# Patient Record
Sex: Male | Born: 1980 | Race: Black or African American | Hispanic: No | Marital: Single
Health system: Southern US, Community
[De-identification: ages and names within clinical notes are randomized; demographics above are authoritative.]

---

## 2016-09-07 ENCOUNTER — Other Ambulatory Visit: Payer: Self-pay | Admitting: Internal Medicine

## 2016-09-07 ENCOUNTER — Ambulatory Visit
Admission: RE | Admit: 2016-09-07 | Discharge: 2016-09-07 | Disposition: A | Discharge status: Home / Self Care | Payer: No Typology Code available for payment source | Source: Ambulatory Visit | Attending: Internal Medicine | Admitting: Internal Medicine

## 2016-09-07 DIAGNOSIS — R7611 Nonspecific reaction to tuberculin skin test without active tuberculosis: Secondary | ICD-10-CM

## 2018-08-08 IMAGING — DX DG CHEST 1V
1 series · 1 of 1 positions shown · non-contrast
Comparison: None

CLINICAL DATA: Positive PPD

EXAM:
CHEST 1 VIEW

[dg chest 1 view]
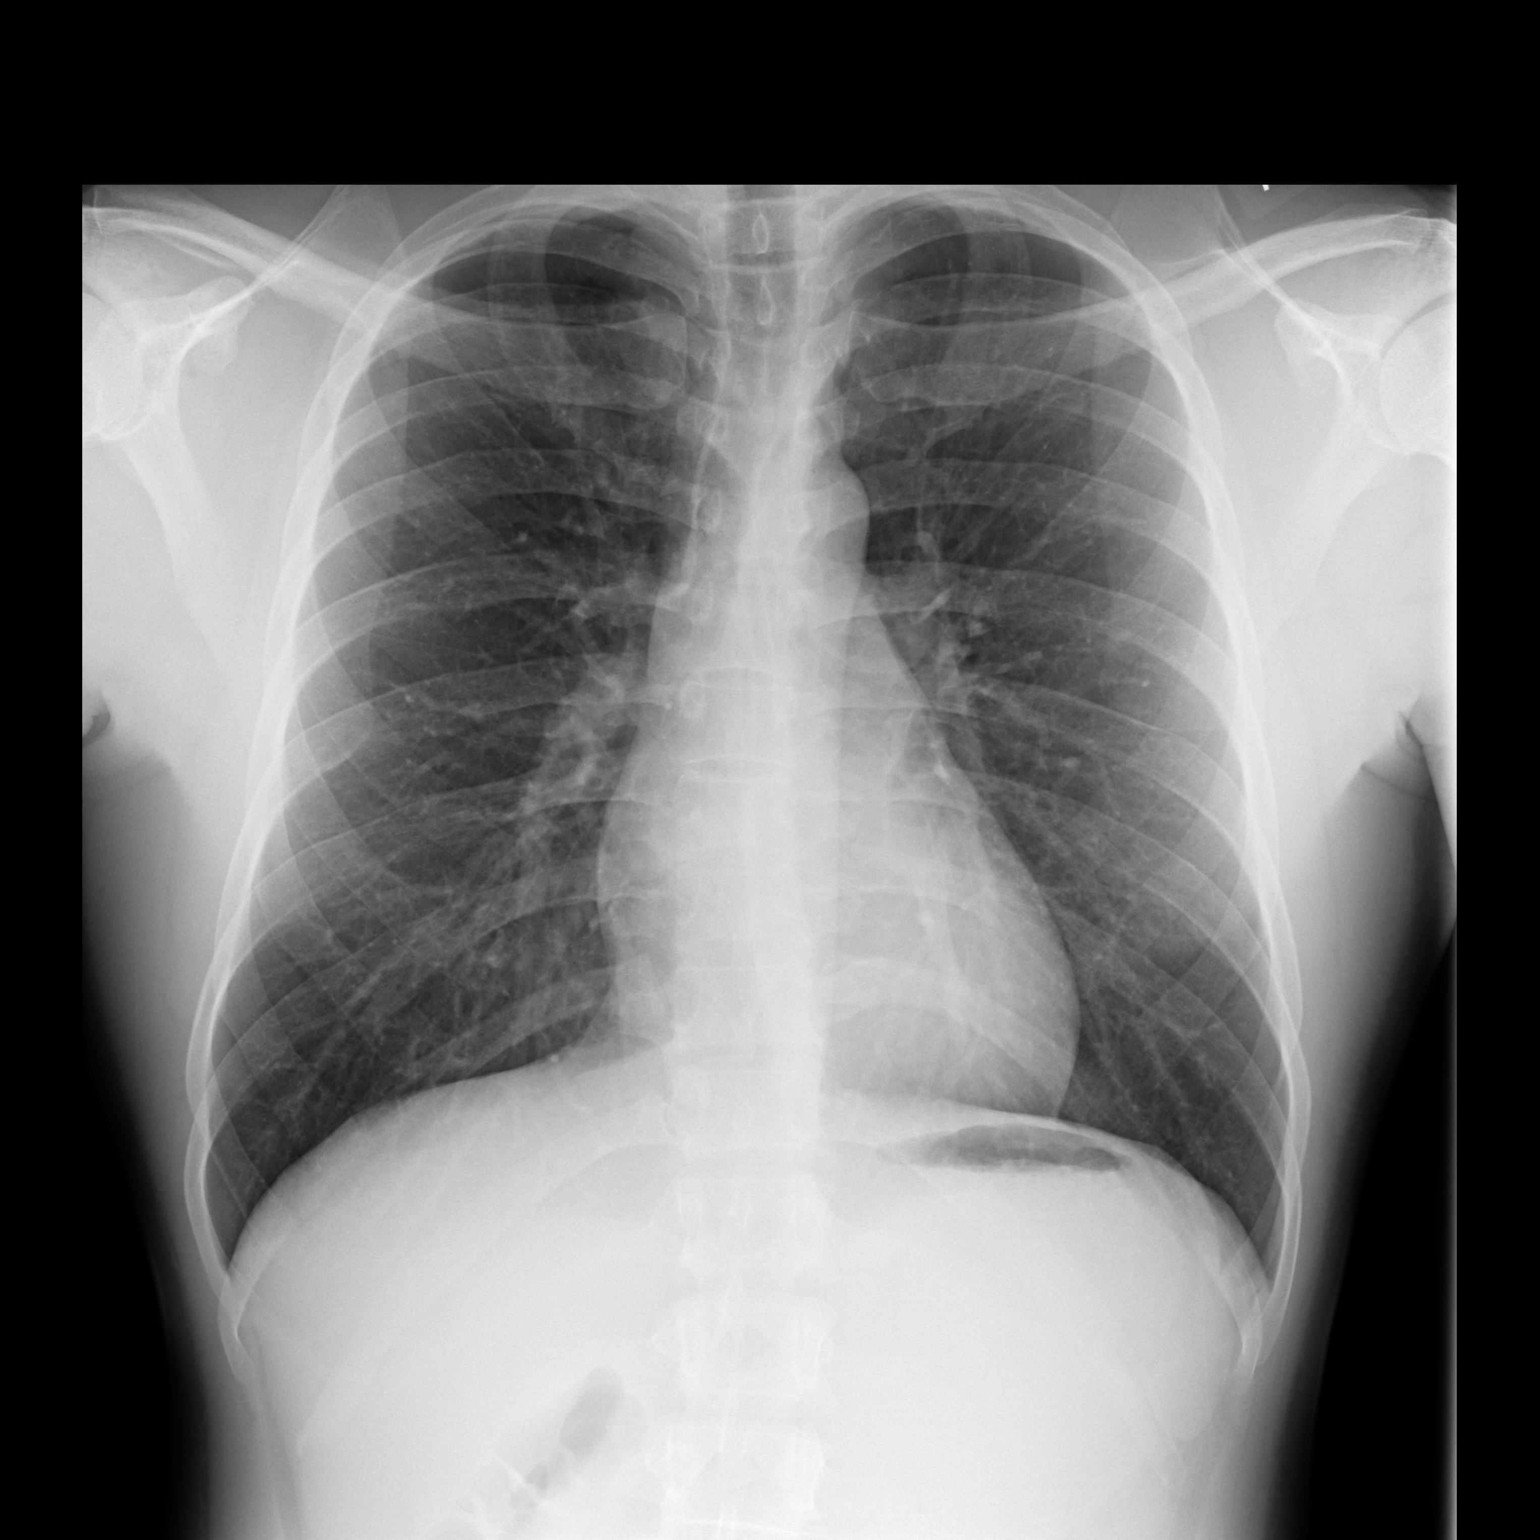

[1 of 1 positions shown; findings below may reference images not displayed]

FINDINGS: Normal heart size, mediastinal contours, and pulmonary vascularity.

Lungs clear.

No pleural effusion or pneumothorax.

Bones unremarkable.
IMPRESSION: Normal exam.

## 2020-02-26 ENCOUNTER — Ambulatory Visit: Payer: No Typology Code available for payment source | Attending: Family

## 2020-02-26 DIAGNOSIS — Z23 Encounter for immunization: Secondary | ICD-10-CM

## 2020-06-15 NOTE — Progress Notes (Signed)
   [facility]  Name:  Krishawn Vanderweele    MRN: [id number] DOB: 1980-11-15  06/15/2020  Mr. Ohlsen was observed post Covid-19 immunization for 15 minutes without incident. He was provided with Vaccine Information Sheet and instruction to access the V-Safe system.   Mr. Maye was instructed to call 911 with any severe reactions post vaccine: Marland Kitchen Difficulty breathing  . Swelling of face and throat  . A fast heartbeat  . A bad rash all over body  . Dizziness and weakness   Immunizations Administered    Name Date Dose VIS Date Route   Moderna Covid-19 Booster Vaccine 02/26/2020  2:15 PM 0.25 mL 11/11/2019 Intramuscular   Manufacturer: Gala Murdoch   Lot: [id number]   NDC: 27782-423-53
